# Patient Record
Sex: Male | Born: 1965 | Hispanic: Yes | Marital: Single
Health system: Southern US, Community
[De-identification: ages and names within clinical notes are randomized; demographics above are authoritative.]

---

## 2015-07-07 ENCOUNTER — Emergency Department (HOSPITAL_COMMUNITY): Payer: Self-pay

## 2015-07-07 ENCOUNTER — Emergency Department (HOSPITAL_COMMUNITY)
Admission: EM | Admit: 2015-07-07 | Discharge: 2015-07-07 | Disposition: A | Discharge status: Home / Self Care | Payer: Self-pay | Attending: Emergency Medicine | Admitting: Emergency Medicine

## 2015-07-07 ENCOUNTER — Encounter (HOSPITAL_COMMUNITY): Payer: Self-pay

## 2015-07-07 DIAGNOSIS — Y998 Other external cause status: Secondary | ICD-10-CM | POA: Insufficient documentation

## 2015-07-07 DIAGNOSIS — Y92410 Unspecified street and highway as the place of occurrence of the external cause: Secondary | ICD-10-CM | POA: Insufficient documentation

## 2015-07-07 DIAGNOSIS — Y9389 Activity, other specified: Secondary | ICD-10-CM | POA: Insufficient documentation

## 2015-07-07 DIAGNOSIS — S52501A Unspecified fracture of the lower end of right radius, initial encounter for closed fracture: Secondary | ICD-10-CM

## 2015-07-07 DIAGNOSIS — S52591A Other fractures of lower end of right radius, initial encounter for closed fracture: Secondary | ICD-10-CM | POA: Insufficient documentation

## 2015-07-07 LAB — CBC
HEMATOCRIT: 43.2 % (ref 39.0–52.0)
Hemoglobin: 14.8 g/dL (ref 13.0–17.0)
MCH: 30.5 pg (ref 26.0–34.0)
MCHC: 34.3 g/dL (ref 30.0–36.0)
MCV: 89.1 fL (ref 78.0–100.0)
Platelets: 229 10*3/uL (ref 150–400)
RBC: 4.85 MIL/uL (ref 4.22–5.81)
RDW: 12.6 % (ref 11.5–15.5)
WBC: 17 10*3/uL — AB (ref 4.0–10.5)

## 2015-07-07 LAB — BASIC METABOLIC PANEL
ANION GAP: 6 (ref 5–15)
BUN: 17 mg/dL (ref 6–20)
CHLORIDE: 108 mmol/L (ref 101–111)
CO2: 25 mmol/L (ref 22–32)
Calcium: 9.2 mg/dL (ref 8.9–10.3)
Creatinine, Ser: 0.92 mg/dL (ref 0.61–1.24)
GFR calc non Af Amer: >60 mL/min (ref >60)
Glucose, Bld: 126 mg/dL — ABNORMAL HIGH (ref 65–99)
POTASSIUM: 4.2 mmol/L (ref 3.5–5.1)
SODIUM: 139 mmol/L (ref 135–145)

## 2015-07-07 MED ORDER — ONDANSETRON HCL 4 MG/2ML IJ SOLN
4.0000 mg | Freq: Once | INTRAMUSCULAR | Status: AC
  Administered 2015-07-07: 4 mg via INTRAVENOUS
  Filled 2015-07-07: qty 2

## 2015-07-07 MED ORDER — MORPHINE SULFATE (PF) 4 MG/ML IV SOLN
4.0000 mg | Freq: Once | INTRAVENOUS | Status: AC
  Administered 2015-07-07: 4 mg via INTRAVENOUS
  Filled 2015-07-07: qty 1

## 2015-07-07 MED ORDER — LIDOCAINE HCL 2 % IJ SOLN
10.0000 mL | Freq: Once | INTRAMUSCULAR | Status: AC
  Administered 2015-07-07: 200 mg via INTRADERMAL
  Filled 2015-07-07: qty 20

## 2015-07-07 MED ORDER — HYDROMORPHONE HCL 1 MG/ML IJ SOLN
1.0000 mg | Freq: Once | INTRAMUSCULAR | Status: AC
  Administered 2015-07-07: 1 mg via INTRAVENOUS
  Filled 2015-07-07: qty 1

## 2015-07-07 MED ORDER — HYDROCODONE-ACETAMINOPHEN 5-325 MG PO TABS: 1.0000 | ORAL_TABLET | ORAL | Status: AC | PRN

## 2015-07-07 NOTE — Discharge Instructions (Signed)
Discharge Instructions   You have a dressing with a plaster splint incorporated in it. Move your fingers as much as possible, making a full fist and fully opening the fist. Elevate your hand to reduce pain & swelling of the digits.  Ice over the operative site may be helpful to reduce pain & swelling.  DO NOT USE HEAT. Pain medicine has been prescribed for you.  Use your medicine as needed over the first 48 hours, and then you can begin to taper your use.  You may use Tylenol in place of your prescribed pain medication, but not IN ADDITION to it. Leave the dressing in place until you return to our office.  You may shower, but keep the bandage clean & dry.  You may drive a car when you are off of prescription pain medications and can safely control your vehicle with both hands.   Please call [phone] during normal business hours or [phone] after hours for any problems. Including the following:  - excessive redness of the incisions - drainage for more than 4 days - fever of more than 101.5 F  *Please note that pain medications will not be refilled after hours or on weekends.  Acute Compartment Syndrome Compartment syndrome is a painful condition that occurs when swelling and pressure build up in a body space (compartment) of the arms or legs. Groups of muscles, nerves, and blood vessels in the arms and legs are separated into various compartments. Each compartment is surrounded by tough layers of tissue called fascia. In compartment syndrome, pressure builds up within the layers of fascia and begins to push on the structures within that compartment.  In acute compartment syndrome, the pressure builds up suddenly, often as the result of an injury. This is a surgical emergency. When a muscle in the compartment moves, you may feel severe pain. If pressure continues to increase, it can block the flow of blood in the smallest blood vessels (capillaries). Then, the nerves and muscles in the  compartment cannot get enough oxygen and nutrients (substances needed for survival). They will start to die within 4-8 hours. That is why the pressure needs to be relieved immediately. Identifying the condition early and treating it quickly can prevent most problems. CAUSES  Various things can lead to compartment syndrome. Possible causes include:   Injury. Some injuries can cause swelling or bleeding in a compartment. This can lead to compartment syndrome. Injuries that may cause this problem include:  Broken bones, especially the long bones of the arms and legs.  Crushing injuries.  Penetrating injuries, such as a knife wound that punctures the skin and tissue underneath.  Badly bruised muscles.  Poisonous bites, such as a snake bite.  Severe burns.  Blocked blood flow. This could result from:  A cast or bandage that is too tight.  A surgical procedure. Blood flow sometimes has to be stopped for a while during a surgery, usually with a tourniquet.  Lying for too long in a position that restricts blood flow. This can happen in people who have nerve damage or if a person is unconscious for a long time.  Drugs used to build up muscles (anabolic steroids).  Drugs that keep the blood from forming clots (blood thinners). SIGNS AND SYMPTOMS  The most common symptom of compartment syndrome is pain. The pain may:   Get worse when moving or stretching the affected body part.  Be more severe than it should be for an injury.  Come along with a  feeling of tingling or burning.  Become worse when the area is pushed or squeezed.  Be unaffected by pain medicine. Other symptoms include:   A feeling of tightness or fullness in the affected area.   A loss of feeling.  Weakness in the area.  Loss of movement.  Skin becoming pale, tight, and shiny over the painful area.  DIAGNOSIS  Your health care provider may suspect the problem based on how you describe the pain. The diagnosis  is made by using a special device that measures the pressure in the affected area. Blood tests, X-rays, or an ultrasound exam may be done to help rule out other problems.  TREATMENT  Compartment syndrome is a surgical emergency. It should be treated very quickly.   First-aid treatment is given first. This may include:  Promptly treating an injury.  Loosening or removing any cast, bandage, or external wrap that may be causing pain.  Raising the painful arm or leg to the same level as the heart.  Giving oxygen.  Giving fluid through an IV access tube that is put into a vein in the hand or arm.  Surgery (fasciotomy) is needed to relieve the pressure and help prevent permanent damage. In this surgery, cuts (incisions) are made through the fascia to relieve the pressure in the compartment.   This information is not intended to replace advice given to you by your health care provider. Make sure you discuss any questions you have with your health care provider.   Document Released: 01/06/2009 Document Revised: 09/20/2012 Document Reviewed: 08/22/2012 Elsevier Interactive Patient Education 2016 Else[employer] Inc[employer]    Cast or Splint Care Casts and splints support injured limbs and keep bones from moving while they heal. It is important to care for your cast or splint at home.  HOME CARE INSTRUCTIONS  Keep the cast or splint uncovered during the drying period. It can take 24 to 48 hours to dry if it is made of plaster. A fiberglass cast will dry in less than 1 hour.  Do not rest the cast on anything harder than a pillow for the first 24 hours.  Do not put weight on your injured limb or apply pressure to the cast until your health care provider gives you permission.  Keep the cast or splint dry. Wet casts or splints can lose their shape and may not support the limb as well. A wet cast that has lost its shape can also create harmful pressure on your skin when it dries. Also, wet skin can become  infected.  Cover the cast or splint with a plastic bag when bathing or when out in the rain or snow. If the cast is on the trunk of the body, take sponge baths until the cast is removed.  If your cast does become wet, dry it with a towel or a blow dryer on the cool setting only.  Keep your cast or splint clean. Soiled casts may be wiped with a moistened cloth.  Do not place any hard or soft foreign objects under your cast or splint, such as cotton, toilet paper, lotion, or powder.  Do not try to scratch the skin under the cast with any object. The object could get stuck inside the cast. Also, scratching could lead to an infection. If itching is a problem, use a blow dryer on a cool setting to relieve discomfort.  Do not trim or cut your cast or remove padding from inside of it.  Exercise all joints next  to the injury that are not immobilized by the cast or splint. For example, if you have a long leg cast, exercise the hip joint and toes. If you have an arm cast or splint, exercise the shoulder, elbow, thumb, and fingers.  Elevate your injured arm or leg on 1 or 2 pillows for the first 1 to 3 days to decrease swelling and pain.It is best if you can comfortably elevate your cast so it is higher than your heart. SEEK MEDICAL CARE IF:   Your cast or splint cracks.  Your cast or splint is too tight or too loose.  You have unbearable itching inside the cast.  Your cast becomes wet or develops a soft spot or area.  You have a bad smell coming from inside your cast.  You get an object stuck under your cast.  Your skin around the cast becomes red or raw.  You have new pain or worsening pain after the cast has been applied. SEEK IMMEDIATE MEDICAL CARE IF:   You have fluid leaking through the cast.  You are unable to move your fingers or toes.  You have discolored (blue or white), cool, painful, or very swollen fingers or toes beyond the cast.  You have tingling or numbness around the  injured area.  You have severe pain or pressure under the cast.  You have any difficulty with your breathing or have shortness of breath.  You have chest pain.   This information is not intended to replace advice given to you by your health care provider. Make sure you discuss any questions you have with your health care provider.   Document Released: 01/16/2000 Document Revised: 11/08/2012 Document Reviewed: 07/27/2012 Elsevier Interactive Patient Education [employer]2016 Elsevier Inc.
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
## 2015-07-07 NOTE — Progress Notes (Signed)
Orthopedic Tech Progress Note Patient Details:  Allen Olson [birth year] [id number]  Ortho Devices Type of Ortho Device: Ace wrap, Arm sling, Sugartong splint Ortho Device/Splint Location: RUE Ortho Device/Splint Interventions: Ordered   Jennye MoccasinHughes, Mistee Soliman Craig 07/07/2015, 3:31 PM

## 2016-10-17 IMAGING — CR DG WRIST COMPLETE 3+V*R*
4 series · 4 of 4 positions shown · non-contrast
Comparison: 07/07/2015

CLINICAL DATA: Postreduction.

EXAM:
RIGHT WRIST - COMPLETE 3+ VIEW

[PA (1 of 3)]
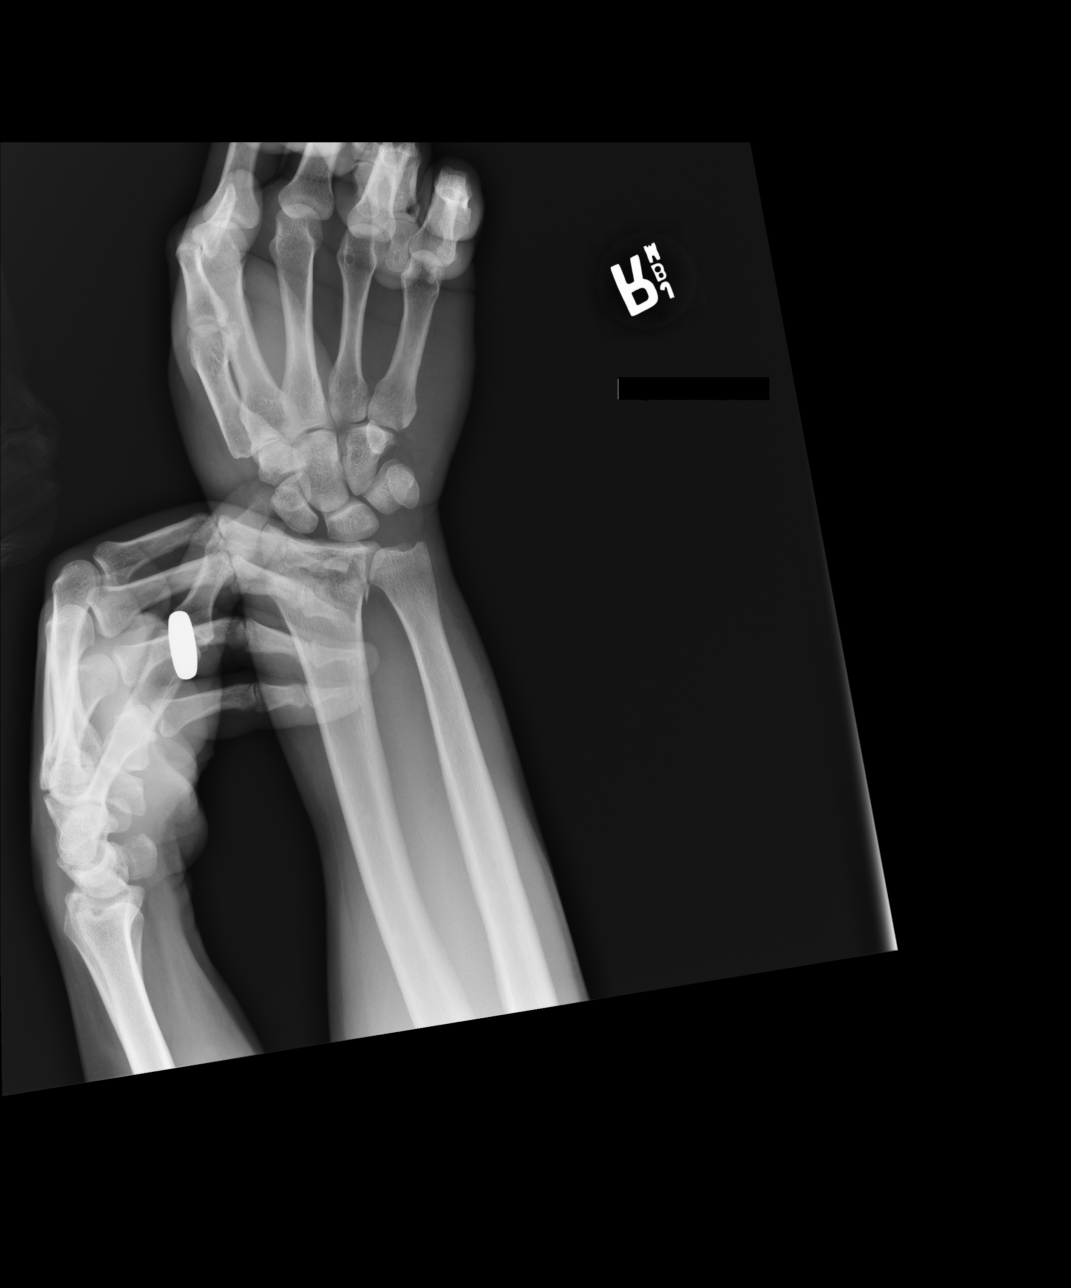

[lateral]
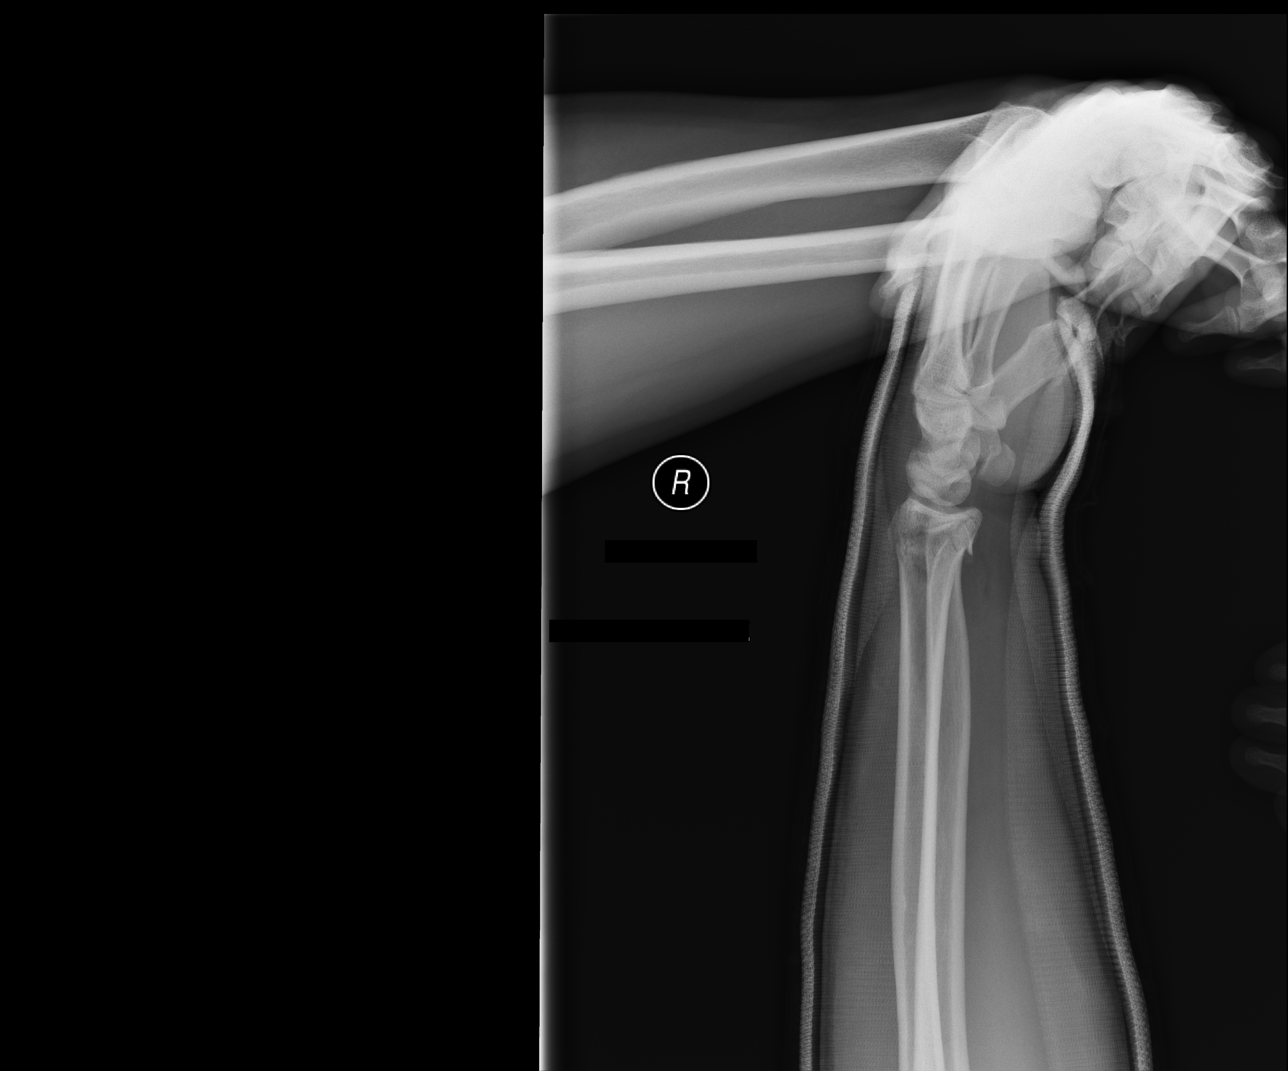

[PA (2 of 3)]
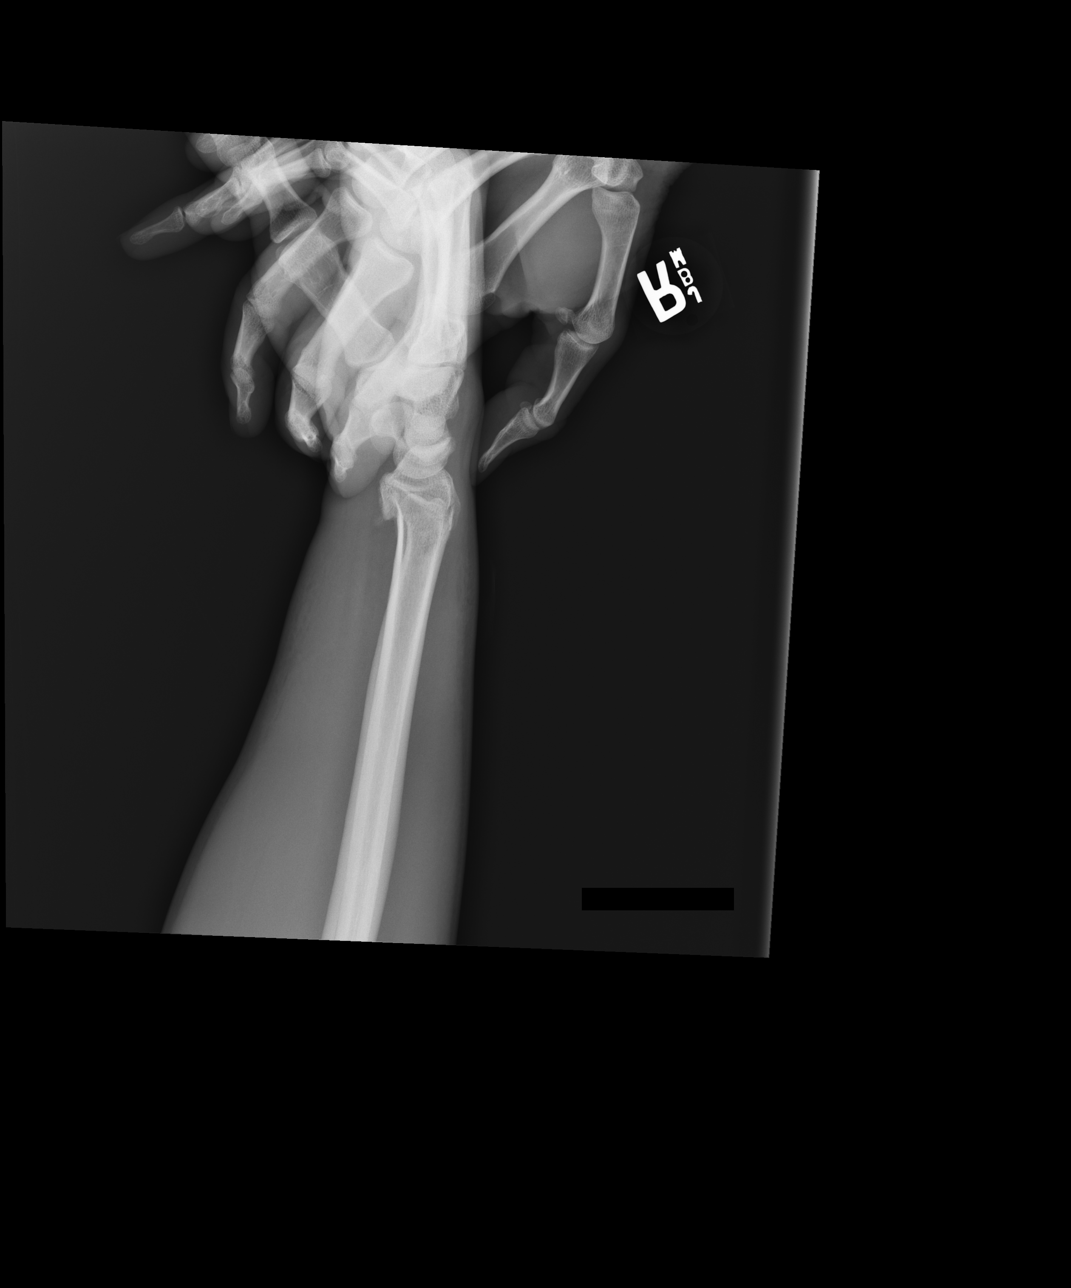

[PA (3 of 3)]
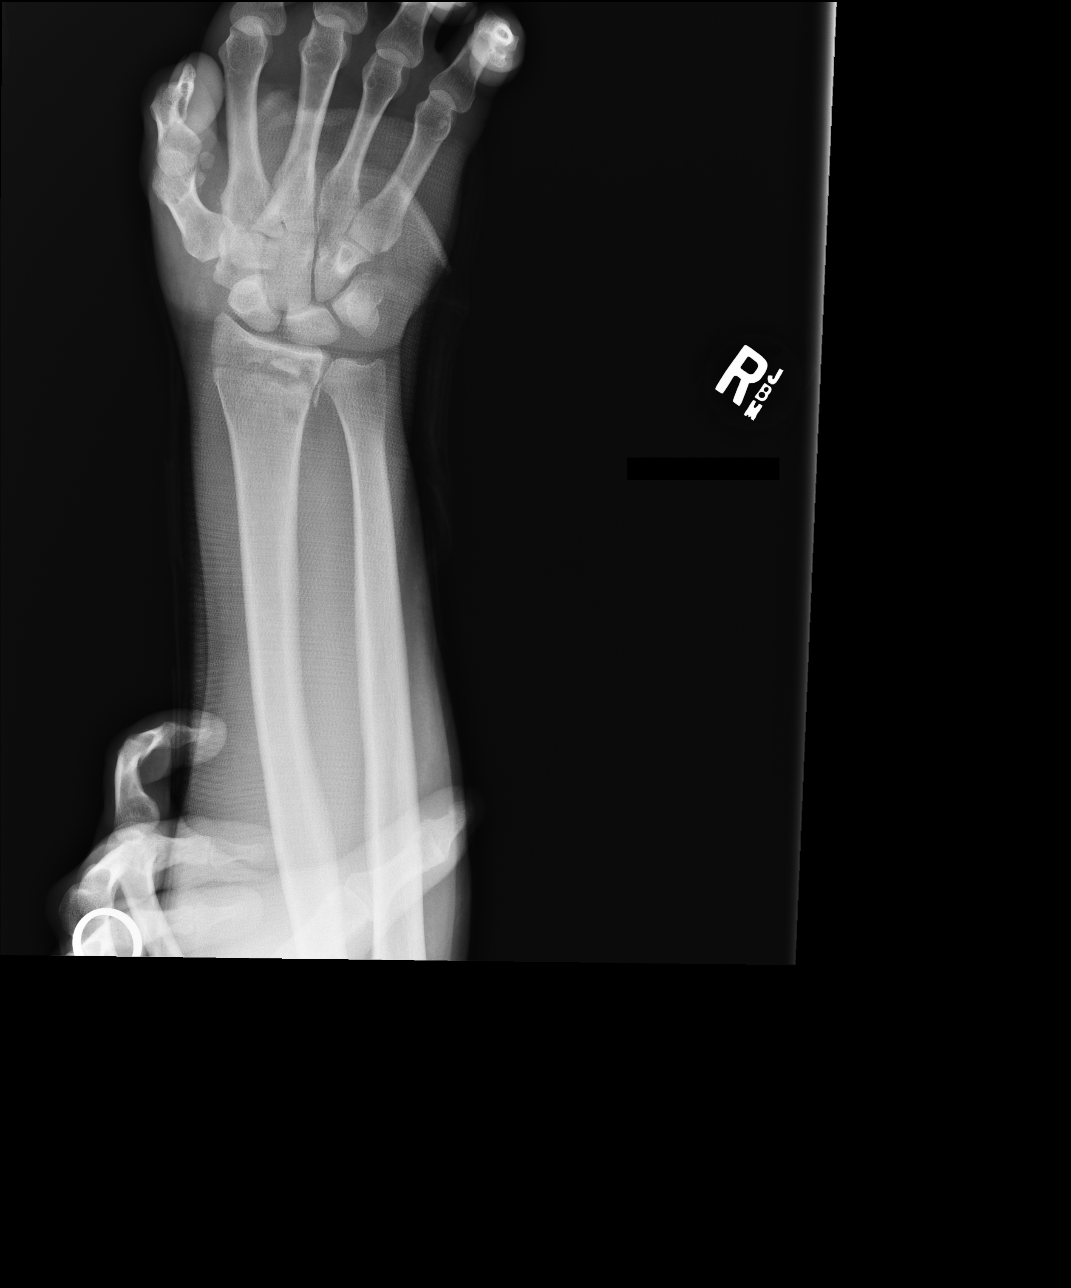

[4 of 4 positions shown; findings below may reference images not displayed]

FINDINGS: Interval reduction of the displaced distal right radial fracture.
Near anatomic alignment. Final images demonstrate in splint views.
IMPRESSION: Near anatomic alignment following reduction of the displaced distal
right radial fracture.
